# Patient Record
Sex: Female | Born: 1990 | Race: Black or African American | Hispanic: No | Marital: Single | Smoking: Never smoker
Health system: Southern US, Community
[De-identification: ages and names within clinical notes are randomized; demographics above are authoritative.]

## PROBLEM LIST (undated history)

## (undated) DIAGNOSIS — I10 Essential (primary) hypertension: Secondary | ICD-10-CM

## (undated) DIAGNOSIS — N809 Endometriosis, unspecified: Secondary | ICD-10-CM

## (undated) HISTORY — PX: NO PAST SURGERIES: SHX2092

## (undated) HISTORY — DX: Essential (primary) hypertension: I10

---
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
## 2008-06-19 ENCOUNTER — Emergency Department (HOSPITAL_COMMUNITY): Admission: EM | Admit: 2008-06-19 | Discharge: 2008-06-20 | Payer: Self-pay | Admitting: Emergency Medicine

## 2010-01-12 ENCOUNTER — Inpatient Hospital Stay (HOSPITAL_COMMUNITY)
Admission: AD | Admit: 2010-01-12 | Discharge: 2010-01-12 | Payer: Self-pay | Source: Home / Self Care | Admitting: Obstetrics and Gynecology

## 2010-01-12 ENCOUNTER — Ambulatory Visit: Payer: Self-pay | Admitting: Obstetrics and Gynecology

## 2010-03-14 ENCOUNTER — Ambulatory Visit (HOSPITAL_COMMUNITY): Admission: RE | Admit: 2010-03-14 | Discharge: 2010-03-14 | Payer: Self-pay | Admitting: Obstetrics & Gynecology

## 2010-04-25 ENCOUNTER — Inpatient Hospital Stay (HOSPITAL_COMMUNITY): Admission: RE | Admit: 2010-04-25 | Discharge: 2010-04-28 | Payer: Self-pay | Admitting: Family Medicine

## 2010-04-25 ENCOUNTER — Ambulatory Visit: Payer: Self-pay | Admitting: Obstetrics and Gynecology

## 2010-04-26 ENCOUNTER — Encounter: Payer: Self-pay | Admitting: Family Medicine

## 2010-08-16 LAB — CBC
HCT: 29.2 % — ABNORMAL LOW (ref 36.0–46.0)
Hemoglobin: 9.5 g/dL — ABNORMAL LOW (ref 12.0–15.0)
MCH: 23.5 pg — ABNORMAL LOW (ref 26.0–34.0)
MCHC: 32.6 g/dL (ref 30.0–36.0)
MCV: 72.1 fL — ABNORMAL LOW (ref 78.0–100.0)
Platelets: 247 10*3/uL (ref 150–400)
RBC: 4.05 MIL/uL (ref 3.87–5.11)
RDW: 17.5 % — ABNORMAL HIGH (ref 11.5–15.5)
WBC: 9.2 10*3/uL (ref 4.0–10.5)

## 2010-08-16 LAB — RPR: RPR Ser Ql: NONREACTIVE

## 2010-08-16 LAB — GLUCOSE, CAPILLARY: Glucose-Capillary: 93 mg/dL (ref 70–99)

## 2010-08-19 LAB — URINALYSIS, ROUTINE W REFLEX MICROSCOPIC
Bilirubin Urine: NEGATIVE
Glucose, UA: NEGATIVE mg/dL
Hgb urine dipstick: NEGATIVE
Ketones, ur: NEGATIVE mg/dL
Nitrite: NEGATIVE
Protein, ur: NEGATIVE mg/dL
Specific Gravity, Urine: 1.02 (ref 1.005–1.030)
Urobilinogen, UA: 0.2 mg/dL (ref 0.0–1.0)
pH: 7 (ref 5.0–8.0)

## 2010-08-19 LAB — CBC
HCT: 28.6 % — ABNORMAL LOW (ref 36.0–46.0)
Hemoglobin: 9.8 g/dL — ABNORMAL LOW (ref 12.0–15.0)
MCH: 28.4 pg (ref 26.0–34.0)
MCHC: 34.2 g/dL (ref 30.0–36.0)
MCV: 82.8 fL (ref 78.0–100.0)
Platelets: 250 10*3/uL (ref 150–400)
RBC: 3.45 MIL/uL — ABNORMAL LOW (ref 3.87–5.11)
RDW: 12.5 % (ref 11.5–15.5)
WBC: 8.4 10*3/uL (ref 4.0–10.5)

## 2010-08-19 LAB — ABO/RH: ABO/RH(D): A POS

## 2010-08-19 LAB — WET PREP, GENITAL
Trich, Wet Prep: NONE SEEN
Yeast Wet Prep HPF POC: NONE SEEN

## 2010-08-19 LAB — POCT PREGNANCY, URINE: Preg Test, Ur: POSITIVE

## 2010-08-19 LAB — GC/CHLAMYDIA PROBE AMP, GENITAL
Chlamydia, DNA Probe: NEGATIVE
GC Probe Amp, Genital: NEGATIVE

## 2010-08-19 LAB — HCG, QUANTITATIVE, PREGNANCY: hCG, Beta Chain, Quant, S: 16287 m[IU]/mL — ABNORMAL HIGH (ref <5)

## 2010-08-21 ENCOUNTER — Emergency Department (HOSPITAL_COMMUNITY)
Admission: EM | Admit: 2010-08-21 | Discharge: 2010-08-21 | Disposition: A | Discharge status: Home / Self Care | Payer: Medicaid Other | Attending: Emergency Medicine | Admitting: Emergency Medicine

## 2010-08-21 DIAGNOSIS — IMO0002 Reserved for concepts with insufficient information to code with codable children: Secondary | ICD-10-CM | POA: Insufficient documentation

## 2010-08-21 DIAGNOSIS — D573 Sickle-cell trait: Secondary | ICD-10-CM | POA: Insufficient documentation

## 2010-08-24 LAB — CULTURE, ROUTINE-ABSCESS

## 2010-09-19 LAB — URINE MICROSCOPIC-ADD ON

## 2010-09-19 LAB — CBC
HCT: 39.5 % (ref 36.0–46.0)
Hemoglobin: 13.2 g/dL (ref 12.0–15.0)
MCHC: 33.4 g/dL (ref 30.0–36.0)
MCV: 81.2 fL (ref 78.0–100.0)
Platelets: 318 10*3/uL (ref 150–400)
RBC: 4.86 MIL/uL (ref 3.87–5.11)
RDW: 13.1 % (ref 11.5–15.5)
WBC: 14.1 10*3/uL — ABNORMAL HIGH (ref 4.0–10.5)

## 2010-09-19 LAB — BASIC METABOLIC PANEL
BUN: 7 mg/dL (ref 6–23)
CO2: 26 mEq/L (ref 19–32)
Calcium: 8.9 mg/dL (ref 8.4–10.5)
Chloride: 101 mEq/L (ref 96–112)
Creatinine, Ser: 0.91 mg/dL (ref 0.4–1.2)
GFR calc Af Amer: >60 mL/min (ref >60)
GFR calc non Af Amer: >60 mL/min (ref >60)
Glucose, Bld: 154 mg/dL — ABNORMAL HIGH (ref 70–99)
Potassium: 3.7 mEq/L (ref 3.5–5.1)
Sodium: 136 mEq/L (ref 135–145)

## 2010-09-19 LAB — URINALYSIS, ROUTINE W REFLEX MICROSCOPIC
Bilirubin Urine: NEGATIVE
Glucose, UA: NEGATIVE mg/dL
Ketones, ur: NEGATIVE mg/dL
Nitrite: POSITIVE — AB
Protein, ur: 30 mg/dL — AB
Specific Gravity, Urine: 1.012 (ref 1.005–1.030)
Urobilinogen, UA: 0.2 mg/dL (ref 0.0–1.0)
pH: 6 (ref 5.0–8.0)

## 2010-09-19 LAB — DIFFERENTIAL
Basophils Absolute: 0 10*3/uL (ref 0.0–0.1)
Basophils Relative: 0 % (ref 0–1)
Eosinophils Absolute: 0 10*3/uL (ref 0.0–0.7)
Eosinophils Relative: 0 % (ref 0–5)
Lymphocytes Relative: 14 % (ref 12–46)
Lymphs Abs: 1.9 10*3/uL (ref 0.7–4.0)
Monocytes Absolute: 1.1 10*3/uL — ABNORMAL HIGH (ref 0.1–1.0)
Monocytes Relative: 8 % (ref 3–12)
Neutro Abs: 10.9 10*3/uL — ABNORMAL HIGH (ref 1.7–7.7)
Neutrophils Relative %: 78 % — ABNORMAL HIGH (ref 43–77)

## 2010-09-19 LAB — URINE CULTURE: Colony Count: >=100000

## 2014-06-25 LAB — PROCEDURE REPORT - SCANNED: Pap: NEGATIVE

## 2016-02-27 ENCOUNTER — Encounter: Payer: Self-pay | Admitting: *Deleted

## 2017-04-30 ENCOUNTER — Other Ambulatory Visit: Payer: Self-pay | Admitting: Obstetrics and Gynecology

## 2017-04-30 ENCOUNTER — Other Ambulatory Visit (HOSPITAL_COMMUNITY)
Admission: RE | Admit: 2017-04-30 | Discharge: 2017-04-30 | Disposition: A | Discharge status: Home / Self Care | Payer: BC Managed Care – PPO | Source: Ambulatory Visit | Attending: Obstetrics and Gynecology | Admitting: Obstetrics and Gynecology

## 2017-04-30 DIAGNOSIS — Z124 Encounter for screening for malignant neoplasm of cervix: Secondary | ICD-10-CM | POA: Diagnosis present

## 2017-05-02 LAB — CYTOLOGY - PAP
CHLAMYDIA, DNA PROBE: NEGATIVE
DIAGNOSIS: NEGATIVE
Neisseria Gonorrhea: NEGATIVE

## 2017-05-16 ENCOUNTER — Telehealth: Payer: Self-pay

## 2017-05-16 ENCOUNTER — Institutional Professional Consult (permissible substitution): Payer: Self-pay | Admitting: Neurology

## 2017-05-16 NOTE — Telephone Encounter (Signed)
Pt did not show for their appt with Dr. Athar today.  

## 2017-05-18 ENCOUNTER — Encounter: Payer: Self-pay | Admitting: Neurology

## 2017-06-08 ENCOUNTER — Encounter: Payer: Self-pay | Admitting: Neurology

## 2017-06-27 ENCOUNTER — Ambulatory Visit: Payer: BC Managed Care – PPO | Admitting: Neurology

## 2017-06-27 ENCOUNTER — Encounter: Payer: Self-pay | Admitting: Neurology

## 2017-06-27 VITALS — BP 136/80 | HR 84 | Ht 65.0 in | Wt 210.0 lb

## 2017-06-27 DIAGNOSIS — H4710 Unspecified papilledema: Secondary | ICD-10-CM

## 2017-06-27 NOTE — Patient Instructions (Signed)
1.  First we will check MRI of brain with and without contrast and MRV of the head to look for a specific cause of pressure behind the eyes. 2.  The next step would be to order a spinal tap so we can measure the opening pressure of the spinal fluid (to confirm that it is high).  We will test the spinal fluid for cell count, protein, glucose and gram stain/culture. 3.  If testing is consistent with elevated pressure in the head, I will start you on a medication called acetazolamide, which helps reduce pressure in the head.  If we start this medication, I want you to go back to the eye doctor for a recheck in 6 weeks. 4. You will follow up with me in 3 months.

## 2017-06-27 NOTE — Progress Notes (Signed)
Called Ruth Ashley at [facility] to schedule LP. Advised her needs to be after MRI/MRV

## 2017-06-27 NOTE — Progress Notes (Signed)
NEUROLOGY CONSULTATION NOTE  Ruth Ashley MRN: [id number] DOB: May 27, 1991  Referring provider: Dr. Massie Kluver Primary care provider: No PCP  Reason for consult:  Evaluate for pseudotumor cerebri  HISTORY OF PRESENT ILLNESS: Ruth Ashley is a 27 year old female with migraines who presents for evaluation of pseudotumor cerebri.  History and eye exam supplemented by optometrist's note.  She was evaluated by her optometrist, Dr. 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  She reported a recent 40 lbs weight gain.  She has migraines but no new headaches.  She sometimes sees spots but denies visual obscurations.  She denies pulsatile tinnitus.  She has migraines since highschool, described as left frontal, throbbing.  They are associated with photophobia, phonophobia, nausea and vomiting.  They typically last a day and occur once every other week.  Current abortive therapy includes Excedrin.  There are no specific triggers or relieving factors.  PAST MEDICAL HISTORY: History reviewed. No pertinent past medical history.  PAST SURGICAL HISTORY: Past Surgical History:  Procedure Laterality Date  . NO PAST SURGERIES      MEDICATIONS: No current outpatient medications on file prior to visit.   No current facility-administered medications on file prior to visit.     ALLERGIES: No Known Allergies  FAMILY HISTORY: Family History  Problem Relation Age of Onset  . Hypertension Mother   . Kidney disease Mother     SOCIAL HISTORY: Social History   Socioeconomic History  . Marital status: Single    Spouse name: Not on file  . Number of children: Not on file  . Years of education: Not on file  . Highest education level: Not on file  Social Needs  . Financial resource strain: Not on file  . Food insecurity - worry: Not on file  . Food insecurity - inability: Not on file  . Transportation needs - medical: Not on file  .  Transportation needs - non-medical: Not on file  Occupational History  . Not on file  Tobacco Use  . Smoking status: Never Smoker  . Smokeless tobacco: Never Used  Substance and Sexual Activity  . Alcohol use: Yes    Comment: every couple weeks  . Drug use: No  . Sexual activity: Not on file  Other Topics Concern  . Not on file  Social History Narrative  . Not on file    REVIEW OF SYSTEMS: Constitutional: No fevers, chills, or sweats, no generalized fatigue, change in appetite Eyes: No visual changes, double vision, eye pain Ear, nose and throat: No hearing loss, ear pain, nasal congestion, sore throat Cardiovascular: No chest pain, palpitations Respiratory:  No shortness of breath at rest or with exertion, wheezes GastrointestinaI: No nausea, vomiting, diarrhea, abdominal pain, fecal incontinence Genitourinary:  No dysuria, urinary retention or frequency Musculoskeletal:  No neck pain, back pain Integumentary: No rash, pruritus, skin lesions Neurological: as above Psychiatric: No depression, insomnia, anxiety Endocrine: No palpitations, fatigue, diaphoresis, mood swings, change in appetite, change in weight, increased thirst Hematologic/Lymphatic:  No purpura, petechiae. Allergic/Immunologic: no itchy/runny eyes, nasal congestion, recent allergic reactions, rashes  PHYSICAL EXAM: Vitals:   06/27/17 1301  BP: 136/80  Pulse: 84  SpO2: 98%   General: No acute distress.  Patient appears well-groomed.  Head:  Normocephalic/atraumatic Eyes:  fundi examined but not visualized Neck: supple, no paraspinal tenderness, full range of motion Back: No paraspinal tenderness Heart: regular rate and rhythm Lungs: Clear to auscultation bilaterally. Vascular: No carotid  bruits. Neurological Exam: Mental status: alert and oriented to person, place, and time, recent and remote memory intact, fund of knowledge intact, attention and concentration intact, speech fluent and not dysarthric,  language intact. Cranial nerves: CN I: not tested CN II: pupils equal, round and reactive to light, visual fields intact CN III, IV, VI:  full range of motion, no nystagmus, no ptosis CN V: facial sensation intact CN VII: upper and lower face symmetric CN VIII: hearing intact CN IX, X: gag intact, uvula midline CN XI: sternocleidomastoid and trapezius muscles intact CN XII: tongue midline Bulk & Tone: normal, no fasciculations. Motor:  5/5 throughout  Sensation: temperature and vibration sensation intact. Deep Tendon Reflexes:  2+ throughout, toes downgoing.  Finger to nose testing:  Without dysmetria.  Heel to shin:  Without dysmetria.  Gait:  Normal station and stride.  Able to turn and tandem walk. Romberg negative.  IMPRESSION: Bilateral papilledema.  Evaluate for idiopathic intracranial hypertension.  PLAN: 1.  First we will check MRI of brain with and without contrast and MRV of the head to look for secondary etiology such as mass lesion, venous sinus thrombosis. 2.  Afterward, schedule for lumbar puncture to measure the opening pressure of the CSF.  We will analyze CSF for cell count, protein, glucose and gram stain/culture. 3.  If testing is consistent with IIH, I will start acetazolamide 500mg  twice daily.  She should follow up with her optometrist in 6 weeks for re-evaluation and make adjustments accordingly. 4. She will follow up with me in 3 months.    Thank you for allowing me to take part in the care of this patient.  Shon MilletAdam Jaffe, DO  CC: Norva Pavlovitesh Poudyal, OD

## 2017-07-20 ENCOUNTER — Ambulatory Visit
Admission: RE | Admit: 2017-07-20 | Discharge: 2017-07-20 | Disposition: A | Discharge status: Home / Self Care | Payer: BC Managed Care – PPO | Source: Ambulatory Visit | Attending: Neurology | Admitting: Neurology

## 2017-07-20 DIAGNOSIS — H4710 Unspecified papilledema: Secondary | ICD-10-CM

## 2017-07-20 MED ORDER — GADOBENATE DIMEGLUMINE 529 MG/ML IV SOLN
20.0000 mL | Freq: Once | INTRAVENOUS | Status: AC | PRN
  Administered 2017-07-20: 20 mL via INTRAVENOUS

## 2017-07-23 ENCOUNTER — Encounter: Payer: Self-pay | Admitting: Radiology

## 2017-07-23 ENCOUNTER — Ambulatory Visit
Admission: RE | Admit: 2017-07-23 | Discharge: 2017-07-23 | Disposition: A | Discharge status: Home / Self Care | Payer: BC Managed Care – PPO | Source: Ambulatory Visit | Attending: Neurology | Admitting: Neurology

## 2017-07-23 VITALS — BP 120/74 | HR 67

## 2017-07-23 DIAGNOSIS — H4710 Unspecified papilledema: Secondary | ICD-10-CM

## 2017-07-23 LAB — PROTEIN, CSF: TOTAL PROTEIN, CSF: 44 mg/dL (ref 15–45)

## 2017-07-23 LAB — CSF CELL COUNT WITH DIFFERENTIAL
RBC Count, CSF: 0 cells/uL (ref 0–10)
WBC, CSF: 0 cells/uL (ref 0–5)

## 2017-07-23 LAB — GLUCOSE, CSF: GLUCOSE CSF: 68 mg/dL (ref 40–80)

## 2017-07-23 NOTE — Discharge Instructions (Signed)

## 2017-07-24 ENCOUNTER — Telehealth: Payer: Self-pay

## 2017-07-24 MED ORDER — ACETAZOLAMIDE ER 500 MG PO CP12: 500.0000 mg | ORAL_CAPSULE | Freq: Two times a day (BID) | ORAL | 3 refills | Status: DC

## 2017-07-24 NOTE — Telephone Encounter (Signed)
-----   Message from Drema DallasAdam R Jaffe, DO sent at 07/23/2017  3:51 PM EST ----- Spinal tap did show elevated pressure.  I would like to start acetazolamide 500mg  twice daily to help decrease pressure in head.  Side effects may include numbness and tingling, so she should not be worried if she experiences this.  She needs to have her eyes re-examined by the eye doctor in 6 weeks and she should follow up with me afterward.

## 2017-07-24 NOTE — Progress Notes (Signed)
Spoke with patient on the phone, states she is having pain at the site of her LP. Instructed patient to apply ice to the site and call back with any further concerns.

## 2017-07-24 NOTE — Telephone Encounter (Signed)
Called and spoke with Pt, advsd of LP results, Acetazolimide, return to eye dr and f/u with us after.

## 2017-07-27 LAB — CSF CULTURE: RESULT: NO GROWTH

## 2017-07-27 LAB — CSF CULTURE W GRAM STAIN
MICRO NUMBER:: 90212094
SPECIMEN QUALITY:: ADEQUATE

## 2017-09-18 ENCOUNTER — Ambulatory Visit: Payer: BC Managed Care – PPO | Admitting: Neurology

## 2017-10-03 ENCOUNTER — Ambulatory Visit: Payer: BC Managed Care – PPO | Admitting: Neurology

## 2018-08-15 IMAGING — XA DG FLUORO GUIDE SPINAL/SI JT INJ*R*
2 series · 2 of 2 positions shown · non-contrast
Comparison: none

CLINICAL DATA: Bilateral papilledema.  Headaches.

[Series 1: ortho standard · 1 of 1 slices shown (1 of 2)]
[im 1/1]
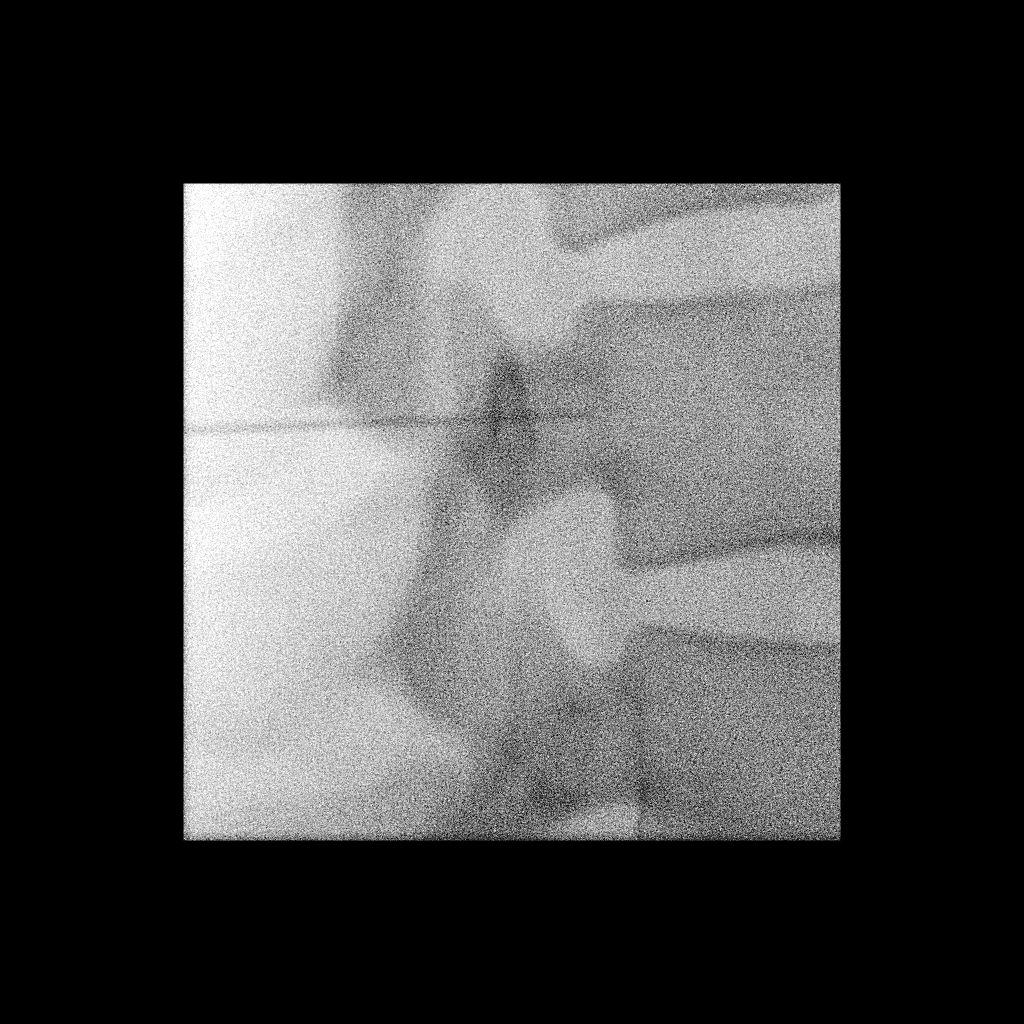

[Series 2: ortho standard · 1 of 1 slices shown (2 of 2)]
[im 1/1]
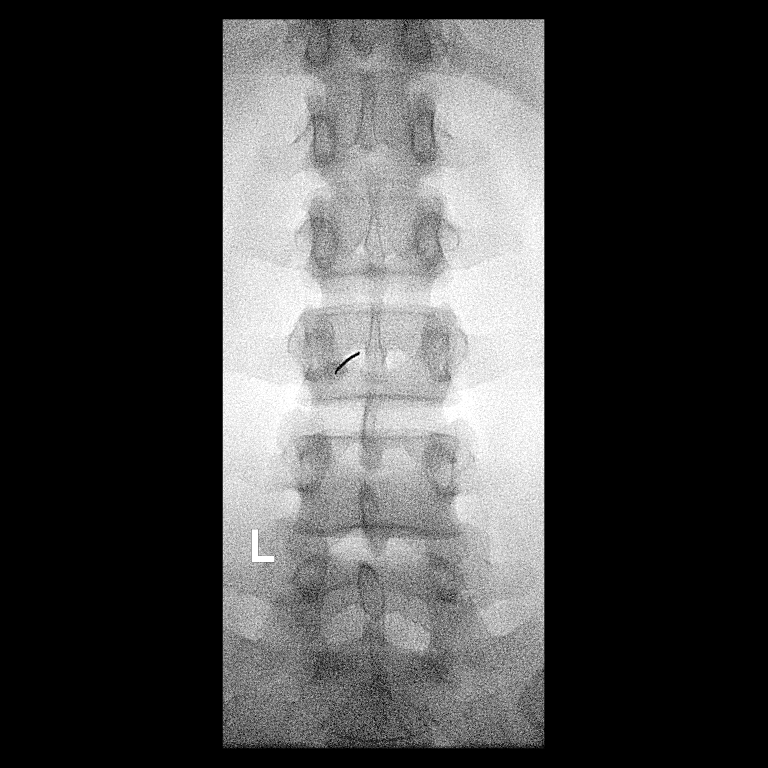

[2 of 2 positions shown; findings below may reference images not displayed]

EXAM:
DIAGNOSTIC LUMBAR PUNCTURE UNDER FLUOROSCOPIC GUIDANCE

FLUOROSCOPY TIME:  Radiation Exposure Index (as provided by the
fluoroscopic device): 16.61 uGy*m2

Fluoroscopy Time:  16 seconds

Number of Acquired Images:  0

PROCEDURE:
Informed consent was obtained from the patient prior to the
procedure, including potential complications of headache, allergy,
and pain. With the patient prone, the lower back was prepped with
Betadine. 1% Lidocaine was used for local anesthesia. Lumbar
puncture was performed at the left paramedian L2-3 level using a 20
gauge needle with return of clear CSF with an opening pressure of
30.5 cm water. 19.0ml of CSF were obtained for laboratory studies.
The closing pressure was normalized at 16.0 cm water. The patient
tolerated the procedure well and there were no apparent
complications.
IMPRESSION: 1. Elevated opening pressure of 30.5 cm water compatible with
idiopathic intracranial hypertension.
2. Following collection of 19.0 mL of clear CSF, the pressure was
normalized at 16.0 cm water.

## 2019-03-13 ENCOUNTER — Ambulatory Visit: Payer: BC Managed Care – PPO | Admitting: Neurology

## 2019-04-11 ENCOUNTER — Other Ambulatory Visit (HOSPITAL_COMMUNITY)
Admission: RE | Admit: 2019-04-11 | Discharge: 2019-04-11 | Disposition: A | Discharge status: Home / Self Care | Payer: BC Managed Care – PPO | Source: Ambulatory Visit | Attending: Obstetrics and Gynecology | Admitting: Obstetrics and Gynecology

## 2019-04-11 ENCOUNTER — Other Ambulatory Visit: Payer: Self-pay | Admitting: Obstetrics and Gynecology

## 2019-04-11 DIAGNOSIS — Z01419 Encounter for gynecological examination (general) (routine) without abnormal findings: Secondary | ICD-10-CM | POA: Insufficient documentation

## 2019-04-17 ENCOUNTER — Other Ambulatory Visit (HOSPITAL_BASED_OUTPATIENT_CLINIC_OR_DEPARTMENT_OTHER): Payer: Self-pay

## 2019-04-17 DIAGNOSIS — R0683 Snoring: Secondary | ICD-10-CM

## 2019-04-17 DIAGNOSIS — R5383 Other fatigue: Secondary | ICD-10-CM

## 2019-04-17 LAB — CYTOLOGY - PAP
Chlamydia: NEGATIVE
Comment: NEGATIVE
Comment: NEGATIVE
Comment: NORMAL
Diagnosis: NEGATIVE
Diagnosis: REACTIVE
High risk HPV: NEGATIVE
Neisseria Gonorrhea: NEGATIVE

## 2019-06-13 ENCOUNTER — Ambulatory Visit (HOSPITAL_BASED_OUTPATIENT_CLINIC_OR_DEPARTMENT_OTHER): Payer: BC Managed Care – PPO | Attending: Internal Medicine | Admitting: Internal Medicine

## 2019-12-09 ENCOUNTER — Other Ambulatory Visit: Payer: Self-pay

## 2019-12-09 ENCOUNTER — Encounter (INDEPENDENT_AMBULATORY_CARE_PROVIDER_SITE_OTHER): Payer: Self-pay | Admitting: Family Medicine

## 2019-12-09 ENCOUNTER — Ambulatory Visit (INDEPENDENT_AMBULATORY_CARE_PROVIDER_SITE_OTHER): Payer: BC Managed Care – PPO | Admitting: Family Medicine

## 2019-12-09 VITALS — BP 139/86 | HR 70 | Temp 98.3°F | Ht 65.0 in | Wt 211.0 lb

## 2019-12-09 DIAGNOSIS — Z6835 Body mass index (BMI) 35.0-35.9, adult: Secondary | ICD-10-CM

## 2019-12-09 DIAGNOSIS — Z9189 Other specified personal risk factors, not elsewhere classified: Secondary | ICD-10-CM | POA: Diagnosis not present

## 2019-12-09 DIAGNOSIS — I10 Essential (primary) hypertension: Secondary | ICD-10-CM

## 2019-12-09 DIAGNOSIS — R5383 Other fatigue: Secondary | ICD-10-CM

## 2019-12-09 DIAGNOSIS — R0602 Shortness of breath: Secondary | ICD-10-CM

## 2019-12-09 DIAGNOSIS — Z0289 Encounter for other administrative examinations: Secondary | ICD-10-CM

## 2019-12-09 DIAGNOSIS — Z1331 Encounter for screening for depression: Secondary | ICD-10-CM | POA: Diagnosis not present

## 2019-12-10 LAB — COMPREHENSIVE METABOLIC PANEL
ALT: 14 IU/L (ref 0–32)
AST: 16 IU/L (ref 0–40)
Albumin/Globulin Ratio: 1.4 (ref 1.2–2.2)
Albumin: 4.5 g/dL (ref 3.8–4.8)
Alkaline Phosphatase: 71 IU/L (ref 48–121)
BUN/Creatinine Ratio: 9 (ref 9–23)
BUN: 7 mg/dL (ref 6–20)
Bilirubin Total: 0.2 mg/dL (ref 0.0–1.2)
CO2: 24 mmol/L (ref 20–29)
Calcium: 9.4 mg/dL (ref 8.7–10.2)
Chloride: 102 mmol/L (ref 96–106)
Creatinine, Ser: 0.8 mg/dL (ref 0.57–1.00)
GFR calc Af Amer: 114 mL/min/{1.73_m2} (ref >59)
GFR calc non Af Amer: 99 mL/min/{1.73_m2} (ref >59)
Globulin, Total: 3.2 g/dL (ref 1.5–4.5)
Glucose: 100 mg/dL — ABNORMAL HIGH (ref 65–99)
Potassium: 4.3 mmol/L (ref 3.5–5.2)
Sodium: 141 mmol/L (ref 134–144)
Total Protein: 7.7 g/dL (ref 6.0–8.5)

## 2019-12-10 LAB — CBC WITH DIFFERENTIAL/PLATELET
Basophils Absolute: 0.1 10*3/uL (ref 0.0–0.2)
Basos: 1 %
EOS (ABSOLUTE): 0.1 10*3/uL (ref 0.0–0.4)
Eos: 1 %
Hematocrit: 41.3 % (ref 34.0–46.6)
Hemoglobin: 13.3 g/dL (ref 11.1–15.9)
Immature Grans (Abs): 0 10*3/uL (ref 0.0–0.1)
Immature Granulocytes: 1 %
Lymphocytes Absolute: 2.9 10*3/uL (ref 0.7–3.1)
Lymphs: 40 %
MCH: 27.5 pg (ref 26.6–33.0)
MCHC: 32.2 g/dL (ref 31.5–35.7)
MCV: 85 fL (ref 79–97)
Monocytes Absolute: 0.6 10*3/uL (ref 0.1–0.9)
Monocytes: 9 %
Neutrophils Absolute: 3.5 10*3/uL (ref 1.4–7.0)
Neutrophils: 48 %
Platelets: 405 10*3/uL (ref 150–450)
RBC: 4.84 x10E6/uL (ref 3.77–5.28)
RDW: 14 % (ref 11.7–15.4)
WBC: 7.2 10*3/uL (ref 3.4–10.8)

## 2019-12-10 LAB — TSH: TSH: 1.39 u[IU]/mL (ref 0.450–4.500)

## 2019-12-10 LAB — LIPID PANEL WITH LDL/HDL RATIO
Cholesterol, Total: 209 mg/dL — ABNORMAL HIGH (ref 100–199)
HDL: 76 mg/dL (ref >39)
LDL Chol Calc (NIH): 100 mg/dL — ABNORMAL HIGH (ref 0–99)
LDL/HDL Ratio: 1.3 ratio (ref 0.0–3.2)
Triglycerides: 197 mg/dL — ABNORMAL HIGH (ref 0–149)
VLDL Cholesterol Cal: 33 mg/dL (ref 5–40)

## 2019-12-10 LAB — FOLATE: Folate: 2.7 ng/mL — ABNORMAL LOW (ref >3.0)

## 2019-12-10 LAB — VITAMIN D 25 HYDROXY (VIT D DEFICIENCY, FRACTURES): Vit D, 25-Hydroxy: 10.5 ng/mL — ABNORMAL LOW (ref 30.0–100.0)

## 2019-12-10 LAB — T3: T3, Total: 137 ng/dL (ref 71–180)

## 2019-12-10 LAB — T4, FREE: Free T4: 1.08 ng/dL (ref 0.82–1.77)

## 2019-12-10 LAB — VITAMIN B12: Vitamin B-12: 408 pg/mL (ref 232–1245)

## 2019-12-10 LAB — INSULIN, RANDOM: INSULIN: 15.5 u[IU]/mL (ref 2.6–24.9)

## 2019-12-10 LAB — HEMOGLOBIN A1C
Est. average glucose Bld gHb Est-mCnc: 105 mg/dL
Hgb A1c MFr Bld: 5.3 % (ref 4.8–5.6)
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
## 2019-12-15 NOTE — Progress Notes (Signed)
Chief Complaint:   OBESITY Ruth Ashley (MR# [id number]) is a 29 y.o. female who presents for evaluation and treatment of obesity and related comorbidities. Current BMI is Body mass index is 35.11 kg/m. Ruth Ashley has been struggling with her weight for many years and has been unsuccessful in either losing weight, maintaining weight loss, or reaching her healthy weight goal.  Ruth Ashley is currently in the action stage of change and ready to dedicate time achieving and maintaining a healthier weight. Ruth Ashley is interested in becoming our patient and working on intensive lifestyle modifications including (but not limited to) diet and exercise for weight loss.  Ruth Ashley habits were reviewed today and are as follows: Her family eats meals together, she thinks her family will eat healthier with her, her desired weight loss is 31 lbs, she started gaining weight in 2016, her heaviest weight ever was 218 pounds, she is a picky eater and doesn't like to eat healthier foods, she skips meals frequently, she is frequently drinking liquids with calories, she frequently makes poor food choices, she has problems with excessive hunger, she has binge eating behaviors and she struggles with emotional eating.  Depression Screen Ruth Ashley Food and Mood (modified PHQ-9) score was 17.  Depression screen PHQ 2/9 12/09/2019  Decreased Interest 1  Down, Depressed, Hopeless 3  PHQ - 2 Score 4  Altered sleeping 3  Tired, decreased energy 3  Change in appetite 3  Feeling bad or failure about yourself  1  Trouble concentrating 3  Moving slowly or fidgety/restless 0  Suicidal thoughts 0  PHQ-9 Score 17  Difficult doing work/chores Somewhat difficult   Subjective:   1. Other fatigue Ruth Ashley admits to daytime somnolence and admits to waking up still tired. Patent has a history of symptoms of daytime fatigue and morning headache. Ruth Ashley generally gets 5 or 6 hours of sleep per night, and states that she  has nightime awakenings. Snoring is present. Apneic episodes are present. Epworth Sleepiness Score is 9.  2. Shortness of breath on exertion Ruth Ashley notes increasing shortness of breath with exercising and seems to be worsening over time with weight gain. She notes getting out of breath sooner with activity than she used to. This has not gotten worse recently. Ruth Ashley denies shortness of breath at rest or orthopnea.  3. Essential hypertension Ruth Ashley blood pressure is borderline elevated today on amlodipine. She would like to improve with diet and exercise.  4. Positive depression screening Ruth Ashley modified PHQ-9 was elevated at 17, and she notes some emotional eating behaviors.  5. At risk for heart disease Ruth Ashley is at a higher than average risk for cardiovascular disease due to obesity.   Assessment/Plan:   1. Other fatigue Ruth Ashley does feel that her weight is causing her energy to be lower than it should be. Fatigue may be related to obesity, depression or many other causes. Labs will be ordered, and in the meanwhile, Cailin will focus on self care including making healthy food choices, increasing physical activity and focusing on stress reduction.  - EKG 12-Lead - CBC with Differential/Platelet - Comprehensive metabolic panel - Hemoglobin A1c - Insulin, random - Lipid Panel With LDL/HDL Ratio - VITAMIN D 25 Hydroxy (Vit-D Deficiency, Fractures) - Vitamin B12 - Folate - T3 - T4, free - TSH  2. Shortness of breath on exertion Ruth Ashley does feel that she gets out of breath more easily that she used to when she exercises. Ruth Ashley shortness of breath appears to be obesity related and  exercise induced. She has agreed to work on weight loss and gradually increase exercise to treat her exercise induced shortness of breath. Will continue to monitor closely.  3. Essential hypertension Ruth Ashley is working on healthy weight loss and exercise to improve blood pressure  control. We will watch for signs of hypotension as she continues her lifestyle modifications. We will check labs today and will follow up.  - CBC with Differential/Platelet - Comprehensive metabolic panel - Hemoglobin A1c - Insulin, random - Lipid Panel With LDL/HDL Ratio - VITAMIN D 25 Hydroxy (Vit-D Deficiency, Fractures) - Vitamin B12 - Folate - T3 - T4, free - TSH  4. Positive depression screening Ruth Ashley had a positive depression screening. Depression is commonly associated with obesity and often results in emotional eating behaviors. We will monitor this closely and work on CBT to help improve the non-hunger eating patterns. We will refer to Dr. Dewaine Conger, our Bariatric Psychologist for evaluation.  5. At risk for heart disease Ruth Ashley was given approximately 30 minutes of coronary artery disease prevention counseling today. She is 29 y.o. female and has risk factors for heart disease including obesity. We discussed intensive lifestyle modifications today with an emphasis on specific weight loss instructions and strategies.   Repetitive spaced learning was employed today to elicit superior memory formation and behavioral change.  6. Class 2 severe obesity with serious comorbidity and body mass index (BMI) of 35.0 to 35.9 in adult, unspecified obesity type (HCC) Ruth Ashley is currently in the action stage of change and her goal is to continue with weight loss efforts. I recommend Ruth Ashley begin the structured treatment plan as follows:  She has agreed to the Category 3 Plan.  Exercise goals: No exercise has been prescribed for now, while we concentrate on nutritional changes.   Behavioral modification strategies: meal planning and cooking strategies and dealing with family or coworker sabotage.  She was informed of the importance of frequent follow-up visits to maximize her success with intensive lifestyle modifications for her multiple health conditions. She was informed we would  discuss her lab results at her next visit unless there is a critical issue that needs to be addressed sooner. Ruth Ashley agreed to keep her next visit at the agreed upon time to discuss these results.  Objective:   Blood pressure 139/86, pulse 70, temperature 98.3 F (36.8 C), temperature source Oral, height 5\' 5"  (1.651 m), weight 211 lb (95.7 kg), last menstrual period 11/30/2019, SpO2 99 %. Body mass index is 35.11 kg/m.  EKG: Normal sinus rhythm, rate 80 BPM.  Indirect Calorimeter completed today shows a VO2 of 265 and a REE of 1847.  Her calculated basal metabolic rate is 12/02/2019 thus her basal metabolic rate is better than expected.  General: Cooperative, alert, well developed, in no acute distress. HEENT: Conjunctivae and lids unremarkable. Cardiovascular: Regular rhythm.  Lungs: Normal work of breathing. Neurologic: No focal deficits.   Lab Results  Component Value Date   CREATININE 0.80 12/09/2019   BUN 7 12/09/2019   NA 141 12/09/2019   K 4.3 12/09/2019   CL 102 12/09/2019   CO2 24 12/09/2019   Lab Results  Component Value Date   ALT 14 12/09/2019   AST 16 12/09/2019   ALKPHOS 71 12/09/2019   BILITOT 0.2 12/09/2019   Lab Results  Component Value Date   HGBA1C 5.3 12/09/2019   Lab Results  Component Value Date   INSULIN 15.5 12/09/2019   Lab Results  Component Value Date   TSH 1.390 12/09/2019  Lab Results  Component Value Date   CHOL 209 (H) 12/09/2019   HDL 76 12/09/2019   LDLCALC 100 (H) 12/09/2019   TRIG 197 (H) 12/09/2019   Lab Results  Component Value Date   WBC 7.2 12/09/2019   HGB 13.3 12/09/2019   HCT 41.3 12/09/2019   MCV 85 12/09/2019   PLT 405 12/09/2019   No results found for: IRON, TIBC, FERRITIN  Attestation Statements:   Reviewed by clinician on day of visit: allergies, medications, problem list, medical history, surgical history, family history, social history, and previous encounter notes.   I, Burt Knack, am acting as  transcriptionist for Quillian Quince, MD.  I have reviewed the above documentation for accuracy and completeness, and I agree with the above. - Quillian Quince, MD

## 2019-12-23 ENCOUNTER — Ambulatory Visit (INDEPENDENT_AMBULATORY_CARE_PROVIDER_SITE_OTHER): Payer: BC Managed Care – PPO | Admitting: Family Medicine

## 2019-12-24 ENCOUNTER — Other Ambulatory Visit: Payer: Self-pay

## 2019-12-24 ENCOUNTER — Telehealth (INDEPENDENT_AMBULATORY_CARE_PROVIDER_SITE_OTHER): Payer: BC Managed Care – PPO | Admitting: Psychology

## 2019-12-24 ENCOUNTER — Encounter (INDEPENDENT_AMBULATORY_CARE_PROVIDER_SITE_OTHER): Payer: Self-pay

## 2019-12-24 ENCOUNTER — Telehealth (INDEPENDENT_AMBULATORY_CARE_PROVIDER_SITE_OTHER): Payer: Self-pay | Admitting: Psychology

## 2019-12-24 NOTE — Telephone Encounter (Signed)
  Office: [phone]  /  Fax: [phone]  Date of Call: December 24, 2019  Time of Call: 12:01pm Provider: Lawerance Cruel, PsyD  CONTENT: This provider called Perlie Gold at 12:01pm as she was scheduled for a new patient MyChart Video Visit at 12pm, but her MyChart status still indicated pending. A HIPAA compliant voicemail was left requesting a call back. Of note, this provider stayed on the MyChart Video Visit appointment for 5 minutes prior to signing off per the clinic's grace period policy.    PLAN: This provider will wait for Tyisha to call back. No further follow-up planned by this provider.

## 2020-07-22 HISTORY — PX: OTHER SURGICAL HISTORY: SHX169

## 2021-03-10 ENCOUNTER — Inpatient Hospital Stay (HOSPITAL_COMMUNITY)
Admission: AD | Admit: 2021-03-10 | Discharge: 2021-03-10 | Disposition: A | Discharge status: Home / Self Care | Payer: BC Managed Care – PPO | Attending: Family Medicine | Admitting: Family Medicine

## 2021-03-10 ENCOUNTER — Inpatient Hospital Stay (HOSPITAL_COMMUNITY): Payer: BC Managed Care – PPO

## 2021-03-10 ENCOUNTER — Encounter (HOSPITAL_COMMUNITY): Payer: Self-pay

## 2021-03-10 DIAGNOSIS — O10911 Unspecified pre-existing hypertension complicating pregnancy, first trimester: Secondary | ICD-10-CM

## 2021-03-10 DIAGNOSIS — R109 Unspecified abdominal pain: Secondary | ICD-10-CM | POA: Diagnosis not present

## 2021-03-10 DIAGNOSIS — Z3A01 Less than 8 weeks gestation of pregnancy: Secondary | ICD-10-CM | POA: Diagnosis not present

## 2021-03-10 DIAGNOSIS — O3680X0 Pregnancy with inconclusive fetal viability, not applicable or unspecified: Secondary | ICD-10-CM

## 2021-03-10 DIAGNOSIS — O26891 Other specified pregnancy related conditions, first trimester: Secondary | ICD-10-CM | POA: Diagnosis not present

## 2021-03-10 DIAGNOSIS — O209 Hemorrhage in early pregnancy, unspecified: Secondary | ICD-10-CM | POA: Diagnosis not present

## 2021-03-10 DIAGNOSIS — O10011 Pre-existing essential hypertension complicating pregnancy, first trimester: Secondary | ICD-10-CM | POA: Insufficient documentation

## 2021-03-10 DIAGNOSIS — R1031 Right lower quadrant pain: Secondary | ICD-10-CM | POA: Diagnosis present

## 2021-03-10 DIAGNOSIS — O10919 Unspecified pre-existing hypertension complicating pregnancy, unspecified trimester: Secondary | ICD-10-CM

## 2021-03-10 HISTORY — DX: Endometriosis, unspecified: N80.9

## 2021-03-10 LAB — COMPREHENSIVE METABOLIC PANEL
ALT: 15 U/L (ref 0–44)
AST: 17 U/L (ref 15–41)
Albumin: 3.5 g/dL (ref 3.5–5.0)
Alkaline Phosphatase: 47 U/L (ref 38–126)
Anion gap: 9 (ref 5–15)
BUN: <5 mg/dL — ABNORMAL LOW (ref 6–20)
CO2: 23 mmol/L (ref 22–32)
Calcium: 9.1 mg/dL (ref 8.9–10.3)
Chloride: 104 mmol/L (ref 98–111)
Creatinine, Ser: 0.79 mg/dL (ref 0.44–1.00)
GFR, Estimated: >60 mL/min (ref >60)
Glucose, Bld: 96 mg/dL (ref 70–99)
Potassium: 3.9 mmol/L (ref 3.5–5.1)
Sodium: 136 mmol/L (ref 135–145)
Total Bilirubin: 0.4 mg/dL (ref 0.3–1.2)
Total Protein: 7.5 g/dL (ref 6.5–8.1)

## 2021-03-10 LAB — WET PREP, GENITAL
Clue Cells Wet Prep HPF POC: NONE SEEN
Sperm: NONE SEEN
Trich, Wet Prep: NONE SEEN
Yeast Wet Prep HPF POC: NONE SEEN

## 2021-03-10 LAB — CBC
HCT: 35.7 % — ABNORMAL LOW (ref 36.0–46.0)
Hemoglobin: 12.1 g/dL (ref 12.0–15.0)
MCH: 27.4 pg (ref 26.0–34.0)
MCHC: 33.9 g/dL (ref 30.0–36.0)
MCV: 80.8 fL (ref 80.0–100.0)
Platelets: 354 10*3/uL (ref 150–400)
RBC: 4.42 MIL/uL (ref 3.87–5.11)
RDW: 13.4 % (ref 11.5–15.5)
WBC: 7.4 10*3/uL (ref 4.0–10.5)
nRBC: 0 % (ref 0.0–0.2)

## 2021-03-10 LAB — URINALYSIS, ROUTINE W REFLEX MICROSCOPIC
Bilirubin Urine: NEGATIVE
Glucose, UA: NEGATIVE mg/dL
Hgb urine dipstick: NEGATIVE
Ketones, ur: NEGATIVE mg/dL
Leukocytes,Ua: NEGATIVE
Nitrite: NEGATIVE
Protein, ur: NEGATIVE mg/dL
Specific Gravity, Urine: 1.005 (ref 1.005–1.030)
pH: 7 (ref 5.0–8.0)

## 2021-03-10 LAB — PROTEIN / CREATININE RATIO, URINE
Creatinine, Urine: 55.11 mg/dL
Total Protein, Urine: <6 mg/dL

## 2021-03-10 LAB — POCT PREGNANCY, URINE: Preg Test, Ur: POSITIVE — AB

## 2021-03-10 LAB — HCG, QUANTITATIVE, PREGNANCY: hCG, Beta Chain, Quant, S: 21507 m[IU]/mL — ABNORMAL HIGH (ref <5)

## 2021-03-10 LAB — TYPE AND SCREEN
ABO/RH(D): A POS
Antibody Screen: NEGATIVE

## 2021-03-10 MED ORDER — PRENATAL VITAMIN AND MINERAL 28-0.8 MG PO TABS: 1.0000 | ORAL_TABLET | Freq: Every day | ORAL | 11 refills | Status: DC

## 2021-03-10 MED ORDER — ACETAMINOPHEN 500 MG PO TABS
1000.0000 mg | ORAL_TABLET | Freq: Once | ORAL | Status: AC
  Administered 2021-03-10: 1000 mg via ORAL
  Filled 2021-03-10: qty 2

## 2021-03-10 MED ORDER — CYCLOBENZAPRINE HCL 5 MG PO TABS
5.0000 mg | ORAL_TABLET | Freq: Once | ORAL | Status: AC
  Administered 2021-03-10: 5 mg via ORAL
  Filled 2021-03-10: qty 1

## 2021-03-10 NOTE — MAU Note (Signed)
.  Ruth Ashley is a 30 y.o. at 108w3d here in MAU reporting: lower abdominal cramping and vaginal bleeding that has been going on all month. She states she just found out she was pregnant. Has been taking tylenol for the pain.  LMP: 01/24/21  Pain score: 8 Vitals:   03/10/21 0901  BP: (!) 156/96  Pulse: 94  Resp: 17  Temp: 98.4 F (36.9 C)  SpO2: 98%   Lab orders placed from triage:  UPT, UA

## 2021-03-10 NOTE — MAU Provider Note (Signed)
History     [id number]  Arrival date and time: 03/10/21 0846    Chief Complaint  Patient presents with   Abdominal Pain   Vaginal Bleeding     HPI Ruth Ashley is a 30 y.o. at [redacted]w[redacted]d by sure LMP with PMHx notable for abdominoplasty in the past year, who presents for vaginal bleeding and RLQ cramping.    Patient reports she took a pregnancy test five days ago that was positive She did so because she'd had a period all throughout September which is unusual for her Prior to getting pregnant her period was regular Bleeding has been light but consistent Also been having cramping in LLQ worse over the past week Denies burning or pain with urination, vaginal discharge Had an abdominoplasty in the past year, otherwise no surgeries Hx of one prior TAB Unplanned pregnancy, would be desired if viable Has not taken anything for her pain in RLQ Worse after abdominal exam  --/--/A POS (10/06 1024)  OB History     Gravida  4   Para  2   Term  2   Preterm  0   AB  0   Living  2      SAB  0   IAB  0   Ectopic  0   Multiple  0   Live Births  2           Past Medical History:  Diagnosis Date   Endometriosis    Hypertension     Past Surgical History:  Procedure Laterality Date   NO PAST SURGERIES     tummy tuck  07/22/2020    Family History  Problem Relation Age of Onset   Hypertension Mother    Kidney disease Mother    Cancer Father    Sudden death Father     Social History   Socioeconomic History   Marital status: Single    Spouse name: Not on file   Number of children: Not on file   Years of education: Not on file   Highest education level: Not on file  Occupational History   Occupation: Dispensing optician  Tobacco Use   Smoking status: Never   Smokeless tobacco: Never  Substance and Sexual Activity   Alcohol use: Yes    Comment: every couple weeks   Drug use: No   Sexual activity: Yes    Birth control/protection: None  Other  Topics Concern   Not on file  Social History Narrative   Not on file   Social Determinants of Health   Financial Resource Strain: Not on file  Food Insecurity: Not on file  Transportation Needs: Not on file  Physical Activity: Not on file  Stress: Not on file  Social Connections: Not on file  Intimate Partner Violence: Not on file    No Known Allergies  No current facility-administered medications on file prior to encounter.   Current Outpatient Medications on File Prior to Encounter  Medication Sig Dispense Refill   acetaminophen (TYLENOL) 500 MG tablet Take 1,000 mg by mouth every 6 (six) hours as needed.     amLODipine (NORVASC) 10 MG tablet Take 10 mg by mouth daily.       ROS Pertinent positives and negative per HPI, all others reviewed and negative  Physical Exam   BP (!) 148/92 (BP Location: Right Arm)   Pulse 88   Temp 98.4 F (36.9 C) (Oral)   Resp 18   Ht 5\' 5"  (1.651 m)  Wt 99.2 kg   LMP 01/24/2021   SpO2 100%   BMI 36.38 kg/m   Patient Vitals for the past 24 hrs:  BP Temp Temp src Pulse Resp SpO2 Height Weight  03/10/21 0922 (!) 148/92 -- -- 88 18 100 % -- --  03/10/21 0901 (!) 156/96 98.4 F (36.9 C) Oral 94 17 98 % 5\' 5"  (1.651 m) 99.2 kg    Physical Exam Vitals reviewed.  Constitutional:      General: She is not in acute distress.    Appearance: She is well-developed. She is not diaphoretic.  Eyes:     General: No scleral icterus. Pulmonary:     Effort: Pulmonary effort is normal. No respiratory distress.  Abdominal:     General: There is no distension.     Palpations: Abdomen is soft.     Tenderness: There is abdominal tenderness in the right lower quadrant. There is no guarding or rebound.  Skin:    General: Skin is warm and dry.  Neurological:     Mental Status: She is alert.     Coordination: Coordination normal.     Cervical Exam    Bedside Ultrasound Pt informed that the ultrasound is considered a limited OB ultrasound  and is not intended to be a complete ultrasound exam.  Patient also informed that the ultrasound is not being completed with the intent of assessing for fetal or placental anomalies or any pelvic abnormalities.  Explained that the purpose of today's ultrasound is to assess for  viability.  Patient acknowledges the purpose of the exam and the limitations of the study.    My interpretation: IUGS seen, unable to visualize other structures transabdominally  FHT N/a  Labs Results for orders placed or performed during the hospital encounter of 03/10/21 (from the past 24 hour(s))  Urinalysis, Routine w reflex microscopic Urine, Clean Catch     Status: Abnormal   Collection Time: 03/10/21  9:05 AM  Result Value Ref Range   Color, Urine STRAW (A) YELLOW   APPearance CLEAR CLEAR   Specific Gravity, Urine 1.005 1.005 - 1.030   pH 7.0 5.0 - 8.0   Glucose, UA NEGATIVE NEGATIVE mg/dL   Hgb urine dipstick NEGATIVE NEGATIVE   Bilirubin Urine NEGATIVE NEGATIVE   Ketones, ur NEGATIVE NEGATIVE mg/dL   Protein, ur NEGATIVE NEGATIVE mg/dL   Nitrite NEGATIVE NEGATIVE   Leukocytes,Ua NEGATIVE NEGATIVE  Pregnancy, urine POC     Status: Abnormal   Collection Time: 03/10/21  9:36 AM  Result Value Ref Range   Preg Test, Ur POSITIVE (A) NEGATIVE  Wet prep, genital     Status: Abnormal   Collection Time: 03/10/21 10:21 AM   Specimen: Vaginal  Result Value Ref Range   Yeast Wet Prep HPF POC NONE SEEN NONE SEEN   Trich, Wet Prep NONE SEEN NONE SEEN   Clue Cells Wet Prep HPF POC NONE SEEN NONE SEEN   WBC, Wet Prep HPF POC MODERATE (A) NONE SEEN   Sperm NONE SEEN   CBC     Status: Abnormal   Collection Time: 03/10/21 10:24 AM  Result Value Ref Range   WBC 7.4 4.0 - 10.5 K/uL   RBC 4.42 3.87 - 5.11 MIL/uL   Hemoglobin 12.1 12.0 - 15.0 g/dL   HCT 05/10/21 (L) 82.9 - 56.2 %   MCV 80.8 80.0 - 100.0 fL   MCH 27.4 26.0 - 34.0 pg   MCHC 33.9 30.0 - 36.0 g/dL   RDW 13.0 86.5 -  15.5 %   Platelets 354 150 - 400 K/uL    nRBC 0.0 0.0 - 0.2 %  Comprehensive metabolic panel     Status: Abnormal   Collection Time: 03/10/21 10:24 AM  Result Value Ref Range   Sodium 136 135 - 145 mmol/L   Potassium 3.9 3.5 - 5.1 mmol/L   Chloride 104 98 - 111 mmol/L   CO2 23 22 - 32 mmol/L   Glucose, Bld 96 70 - 99 mg/dL   BUN <5 (L) 6 - 20 mg/dL   Creatinine, Ser 4.01 0.44 - 1.00 mg/dL   Calcium 9.1 8.9 - 02.7 mg/dL   Total Protein 7.5 6.5 - 8.1 g/dL   Albumin 3.5 3.5 - 5.0 g/dL   AST 17 15 - 41 U/L   ALT 15 0 - 44 U/L   Alkaline Phosphatase 47 38 - 126 U/L   Total Bilirubin 0.4 0.3 - 1.2 mg/dL   GFR, Estimated >25 >36 mL/min   Anion gap 9 5 - 15  Type and screen [facility]     Status: None   Collection Time: 03/10/21 10:24 AM  Result Value Ref Range   ABO/RH(D) A POS    Antibody Screen NEG    Sample Expiration      03/13/2021,2359 Performed at [facility], [address], [place], [place] [address]     Imaging No results found.  MAU Course  Procedures Lab Orders         Wet prep, genital         Urinalysis, Routine w reflex microscopic Urine, Clean Catch         CBC         Comprehensive metabolic panel         hCG, quantitative, pregnancy         Protein / creatinine ratio, urine         Pregnancy, urine POC    Meds ordered this encounter  Medications   cyclobenzaprine (FLEXERIL) tablet 5 mg   acetaminophen (TYLENOL) tablet 1,000 mg   Prenatal Vit-Fe Fumarate-FA (PRENATAL VITAMIN AND MINERAL) 28-0.8 MG TABS    Sig: Take 1 tablet by mouth daily.    Dispense:  30 tablet    Refill:  11   Imaging Orders         US OB LESS THAN 14 WEEKS WITH OB TRANSVAGINAL     MDM moderate  Assessment and Plan  #Vaginal bleeding in pregnancy, first trimester #Abdominal pain in pregnancy, first trimester Ectopic workup completed, patient with normal IUP with normal heart rate. Discussed that studies show 80-90% of pregnancies with vaginal bleeding go on to have normal live birth but  there is increased risk of miscarriage. Blood type A+, rhogam not indicated. Discussed return precautions including severe abdominal pain, heavy vaginal bleeding, and fever. Will send rx for prenatals, already has OB intake scheduled with [facility].  #Chronic hypertension Poorly controlled, not taking her amlodipine. Advised to resume taking, start prenatal ASA in second trimester.   #FWB Normal IUP with normal FHR on [place]  Dispo: Discharged to home in stable condition with return precautions.   Venora Maples, MD/MPH 03/10/21 11:41 AM  Allergies as of 03/10/2021   No Known Allergies      Medication List     TAKE these medications    acetaminophen 500 MG tablet Commonly known as: TYLENOL Take 1,000 mg by mouth every 6 (six) hours as needed.   amLODipine 10 MG  tablet Commonly known as: NORVASC Take 10 mg by mouth daily.   Prenatal Vitamin and Mineral 28-0.8 MG Tabs Take 1 tablet by mouth daily.

## 2021-03-11 LAB — GC/CHLAMYDIA PROBE AMP (~~LOC~~) NOT AT ARMC
Chlamydia: NEGATIVE
Comment: NEGATIVE
Comment: NORMAL
Neisseria Gonorrhea: NEGATIVE

## 2021-10-03 ENCOUNTER — Emergency Department (HOSPITAL_COMMUNITY)
Admission: EM | Admit: 2021-10-03 | Discharge: 2021-10-03 | Disposition: A | Discharge status: Home / Self Care | Payer: BC Managed Care – PPO | Attending: Emergency Medicine | Admitting: Emergency Medicine

## 2021-10-03 ENCOUNTER — Other Ambulatory Visit: Payer: Self-pay

## 2021-10-03 ENCOUNTER — Emergency Department (HOSPITAL_COMMUNITY): Payer: BC Managed Care – PPO

## 2021-10-03 ENCOUNTER — Encounter (HOSPITAL_COMMUNITY): Payer: Self-pay

## 2021-10-03 DIAGNOSIS — M25561 Pain in right knee: Secondary | ICD-10-CM | POA: Diagnosis not present

## 2021-10-03 DIAGNOSIS — R0781 Pleurodynia: Secondary | ICD-10-CM

## 2021-10-03 DIAGNOSIS — R0602 Shortness of breath: Secondary | ICD-10-CM | POA: Insufficient documentation

## 2021-10-03 DIAGNOSIS — R071 Chest pain on breathing: Secondary | ICD-10-CM | POA: Insufficient documentation

## 2021-10-03 DIAGNOSIS — R079 Chest pain, unspecified: Secondary | ICD-10-CM | POA: Diagnosis present

## 2021-10-03 LAB — TROPONIN I (HIGH SENSITIVITY)
Troponin I (High Sensitivity): 2 ng/L (ref <18)
Troponin I (High Sensitivity): 5 ng/L (ref <18)

## 2021-10-03 LAB — CBC
HCT: 40.5 % (ref 36.0–46.0)
Hemoglobin: 13.3 g/dL (ref 12.0–15.0)
MCH: 27.6 pg (ref 26.0–34.0)
MCHC: 32.8 g/dL (ref 30.0–36.0)
MCV: 84 fL (ref 80.0–100.0)
Platelets: 449 10*3/uL — ABNORMAL HIGH (ref 150–400)
RBC: 4.82 MIL/uL (ref 3.87–5.11)
RDW: 14.4 % (ref 11.5–15.5)
WBC: 7.9 10*3/uL (ref 4.0–10.5)
nRBC: 0 % (ref 0.0–0.2)

## 2021-10-03 LAB — BASIC METABOLIC PANEL
Anion gap: 9 (ref 5–15)
BUN: <5 mg/dL — ABNORMAL LOW (ref 6–20)
CO2: 25 mmol/L (ref 22–32)
Calcium: 8.8 mg/dL — ABNORMAL LOW (ref 8.9–10.3)
Chloride: 104 mmol/L (ref 98–111)
Creatinine, Ser: 0.81 mg/dL (ref 0.44–1.00)
GFR, Estimated: >60 mL/min (ref >60)
Glucose, Bld: 96 mg/dL (ref 70–99)
Potassium: 4.1 mmol/L (ref 3.5–5.1)
Sodium: 138 mmol/L (ref 135–145)

## 2021-10-03 LAB — I-STAT BETA HCG BLOOD, ED (MC, WL, AP ONLY): I-stat hCG, quantitative: <5 m[IU]/mL (ref <5)

## 2021-10-03 LAB — D-DIMER, QUANTITATIVE: D-Dimer, Quant: <0.27 ug/mL-FEU (ref 0.00–0.50)

## 2021-10-03 NOTE — Discharge Instructions (Signed)
Please read and follow all provided instructions. ? ?Your diagnoses today include:  ?1. Pleuritic chest pain   ? ? ?Tests performed today include: ?An EKG of your heart ?A chest x-ray ?Cardiac enzymes - a blood test for heart muscle damage ?Blood counts and electrolytes ?Screening test for blood clot: Was negative ?Vital signs. See below for your results today.  ? ?Medications prescribed:  ?None ? ?Take any prescribed medications only as directed. ? ?Follow-up instructions: ?Please follow-up with your primary care provider as soon as you can for further evaluation of your symptoms.  ? ?Return instructions:  ?SEEK IMMEDIATE MEDICAL ATTENTION IF: ?You have severe chest pain, especially if the pain is crushing or pressure-like and spreads to the arms, back, neck, or jaw, or if you have sweating, nausea or vomiting, or trouble with breathing. THIS IS AN EMERGENCY. Do not wait to see if the pain will go away. Get medical help at once. Call 911. DO NOT drive yourself to the hospital.  ?Your chest pain gets worse and does not go away after a few minutes of rest.  ?You have an attack of chest pain lasting longer than what you usually experience.  ?You have significant dizziness, if you pass out, or have trouble walking.  ?You have chest pain not typical of your usual pain for which you originally saw your caregiver.  ?You have any other emergent concerns regarding your health. ? ?Additional Information: ?Chest pain comes from many different causes. Your caregiver has diagnosed you as having chest pain that is not specific for one problem, but does not require admission.  You are at low risk for an acute heart condition or other serious illness.  ? ?Your vital signs today were: ?BP 128/81   Pulse 81   Temp 98.4 ?F (36.9 ?C) (Oral)   Resp 13   Ht 5\' 5"  (1.651 m)   Wt 95.3 kg   LMP 01/18/2021 (Approximate)   SpO2 100%   Breastfeeding Unknown   BMI 34.95 kg/m?  ?If your blood pressure (BP) was elevated above 135/85  this visit, please have this repeated by your doctor within one month. ?-------------- ? ? ?

## 2021-10-03 NOTE — ED Provider Triage Note (Signed)
Emergency Medicine Provider Triage Evaluation Note ? ?Ruth Ashley , a 31 y.o. female  was evaluated in triage.  Pt complains of CP, onset 1AM, woke from sleep, unable to go back to sleep, pain is intermittent, located midsternum and under left breast, does not radiate, with SHOB, dizziness. Denies n/v. ?Similar to prior episodes, never seen for prior symptoms, SHOB prompted work up today.  ?Hx HTN, on medication. NO hx to DM or high cholesterol. ?Family hx- mom had a monitor at one point unknown why ?Review of Systems  ?Positive: CP, SHOB, dizziness ?Negative: Nausea, vomiitng.  ? ?Physical Exam  ?BP (!) 162/105 (BP Location: Right Arm)   Pulse 96   Temp 98.4 ?F (36.9 ?C) (Oral)   Resp 18   Ht 5\' 5"  (1.651 m)   Wt 95.3 kg   LMP 01/24/2021   SpO2 100%   BMI 34.95 kg/m?  ?Gen:   Awake, no distress   ?Resp:  Normal effort  ?MSK:   Moves extremities without difficulty  ?Other:  HR RRR ? ?Medical Decision Making  ?Medically screening exam initiated at 10:50 AM.  Appropriate orders placed.  Ruth Ashley was informed that the remainder of the evaluation will be completed by another provider, this initial triage assessment does not replace that evaluation, and the importance of remaining in the ED until their evaluation is complete. ? ? ?  ?Tacy Learn, PA-C ?10/03/21 1052 ? ?

## 2021-10-03 NOTE — ED Provider Notes (Signed)
?[facility] ?Provider Note ? ? ?CSN: [id number] ?Arrival date & time: 10/03/21  1024 ? ?  ? ?History ? ?Chief Complaint  ?Patient presents with  ? Chest Pain  ? ? ?Ruth EgeQuenisha Ashley is a 31 y.o. female. ? ?Patient with history of hypertension presents to the emergency department for evaluation of chest pain.  Pain occurs in the left chest.  It woke her from sleep at around 1 AM today.  Pain was worse with deep breathing.  She had some associated shortness of breath.  It lasted till about 3 or 4 AM and then went away.  She went to work this morning and the symptoms recurred, prompting emergency department visit.  No fevers or cough.  She has some pain around her right knee that is intermittent.  She does not get this pain on the left.  As far as her chest pain, she has had this pain before.  It occurs approximately once every 2 weeks.  It can occur at anytime of day.  It does not exertional in nature and is not associated with diaphoresis or vomiting.  Patient denies risk factors for pulmonary embolism including: unilateral leg swelling, history of DVT/PE/other blood clots, use of exogenous hormones, recent immobilizations, recent surgery, recent travel (>4hr segment), malignancy, hemoptysis.  ? ? ? ? ?  ? ?Home Medications ?Prior to Admission medications   ?Medication Sig Start Date End Date Taking? Authorizing Provider  ?acetaminophen (TYLENOL) 500 MG tablet Take 1,000 mg by mouth every 6 (six) hours as needed.    [provider]  ?amLODipine (NORVASC) 10 MG tablet Take 10 mg by mouth daily.    [provider]  ?Prenatal Vit-Fe Fumarate-FA (PRENATAL VITAMIN AND MINERAL) 28-0.8 MG TABS Take 1 tablet by mouth daily. 03/10/21   Venora MaplesEckstat, Matthew M, MD  ?   ? ?Allergies    ?Patient has no known allergies.   ? ?Review of Systems   ?Review of Systems ? ?Physical Exam ?Updated Vital Signs ?BP (!) 148/101   Pulse 74   Temp 98.4 ?F (36.9 ?C) (Oral)   Resp 16   Ht 5\' 5"   (1.651 m)   Wt 95.3 kg   LMP 01/18/2021 (Approximate)   SpO2 99%   Breastfeeding Unknown   BMI 34.95 kg/m?  ? ?Physical Exam ?Vitals and nursing note reviewed.  ?Constitutional:   ?   Appearance: She is well-developed. She is not diaphoretic.  ?HENT:  ?   Head: Normocephalic and atraumatic.  ?   Mouth/Throat:  ?   Mouth: Mucous membranes are not dry.  ?Eyes:  ?   Conjunctiva/sclera: Conjunctivae normal.  ?Neck:  ?   Vascular: Normal carotid pulses. No JVD.  ?   Trachea: Trachea normal. No tracheal deviation.  ?Cardiovascular:  ?   Rate and Rhythm: Normal rate and regular rhythm.  ?   Pulses: No decreased pulses.     ?     Radial pulses are 2+ on the right side and 2+ on the left side.  ?   Heart sounds: Normal heart sounds, S1 normal and S2 normal. No murmur heard. ?Pulmonary:  ?   Effort: Pulmonary effort is normal. No respiratory distress.  ?   Breath sounds: No wheezing.  ?Chest:  ?   Chest wall: No tenderness.  ?Abdominal:  ?   General: Bowel sounds are normal.  ?   Palpations: Abdomen is soft.  ?   Tenderness: There is no abdominal tenderness. There is no  guarding or rebound.  ?Musculoskeletal:     ?   General: Normal range of motion.  ?   Cervical back: Normal range of motion and neck supple. No muscular tenderness.  ?   Right lower leg: No tenderness. No edema.  ?   Left lower leg: No tenderness. No edema.  ?   Comments: No calf tenderness on the right or left.  Patient reports tenderness to palpation that is worse with weightbearing on the lateral aspect and anterior portion of the right knee.  No knee effusion.  Patient with full active range of motion.  ?Skin: ?   General: Skin is warm and dry.  ?   Coloration: Skin is not pale.  ?Neurological:  ?   Mental Status: She is alert.  ? ? ?ED Results / Procedures / Treatments   ?Labs ?(all labs ordered are listed, but only abnormal results are displayed) ?Labs Reviewed  ?BASIC METABOLIC PANEL - Abnormal; Notable for the following components:  ?    Result  Value  ? BUN <5 (*)   ? Calcium 8.8 (*)   ? All other components within normal limits  ?CBC - Abnormal; Notable for the following components:  ? Platelets 449 (*)   ? All other components within normal limits  ?D-DIMER, QUANTITATIVE  ?I-STAT BETA HCG BLOOD, ED ([facility], [facility], [facility] ONLY)  ?TROPONIN I (HIGH SENSITIVITY)  ?TROPONIN I (HIGH SENSITIVITY)  ? ? ?EKG ?EKG Interpretation ? ?Date/Time:  Monday Oct 03 2021 11:21:04 EDT ?Ventricular Rate:  80 ?PR Interval:  128 ?QRS Duration: 85 ?QT Interval:  387 ?QTC Calculation: 447 ?R Axis:   42 ?Text Interpretation: Sinus rhythm Borderline repolarization abnormality Confirmed by Kristine Royal [id number]) on 10/03/2021 11:28:57 AM ? ?Radiology ?DG Chest 2 View ? ?Result Date: 10/03/2021 ?CLINICAL DATA:  Intermittent left chest pain since yesterday with shortness of breath. EXAM: CHEST - 2 VIEW COMPARISON:  None. FINDINGS: The heart size and mediastinal contours are within normal limits. Both lungs are clear. The visualized skeletal structures are unremarkable. There is scoliosis of spine. IMPRESSION: No active cardiopulmonary disease. Electronically Signed   By: Sherian Rein M.D.   On: 10/03/2021 11:10   ? ?Procedures ?Procedures  ? ? ?Medications Ordered in ED ?Medications - No data to display ? ?ED Course/ Medical Decision Making/ A&P ?  ? ?Patient seen and examined. History obtained directly from patient. Work-up including labs, imaging, EKG ordered in triage, if performed, were reviewed.   ? ?Labs/EKG: Independently reviewed and interpreted.  This included: EKG which was unremarkable.  Pregnancy.  CBC unremarkable, BMP unremarkable.  Troponin normal.  Added D-dimer after discussion with patient. ? ?Imaging: Independently visualized and interpreted.  This included: Chest x-ray, agree normal. ? ?Medications/Fluids: None ordered. ? ?Most recent vital signs reviewed and are as follows: ?BP (!) 148/101   Pulse 74   Temp 98.4 ?F (36.9 ?C) (Oral)   Resp 16   Ht 5\' 5"  (1.651 m)   Wt 95.3  kg   LMP 01/18/2021 (Approximate)   SpO2 99%   Breastfeeding Unknown   BMI 34.95 kg/m?  ? ?Initial impression: Chest pain, shortness of breath, intermittent.  Currently resolved.  Patient does have some recurrent features.  Also reports right knee pain although both of these problems appear to be chronic and intermittent.  Will rule out possibility of PE. ? ?2:07 PM Reassessment performed. Patient appears comfortable.  She is requesting crackers and something to drink. ? ?Labs personally reviewed and interpreted including: D-dimer  was negative, second troponin negative. ? ?Reviewed pertinent lab work and imaging with patient at bedside. Questions answered.  ? ?Most current vital signs reviewed and are as follows: ?BP (!) 133/92   Pulse 80   Temp 98.5 ?F (36.9 ?C) (Oral)   Resp 17   Ht 5\' 5"  (1.651 m)   Wt 95.3 kg   LMP 01/18/2021 (Approximate)   SpO2 100%   Breastfeeding Unknown   BMI 34.95 kg/m?  ? ?Plan: Discharge to home.  ? ?Prescriptions written for: None ? ?Other home care instructions discussed: Avoidance of anything that makes her symptoms worse ? ?ED return instructions discussed: Return and follow-up instructions: I encouraged patient to return to ED with severe chest pain, especially if the pain is crushing or pressure-like and spreads to the arms, back, neck, or jaw, or if they have associated sweating, vomiting, or shortness of breath with the pain, or significant pain with activity. We discussed that the evaluation here today indicates a low-risk of serious cause of chest pain, including heart trouble or a blood clot, but no evaluation is perfect and chest pain can evolve with time. The patient verbalized understanding and agreed.  I encouraged patient to follow-up with their provider in the next 48 hours for recheck.   ? ?Follow-up instructions discussed: Patient encouraged to follow-up with their PCP in 3 days.  ? ? ? ?                        ?Medical Decision Making ?Amount and/or  Complexity of Data Reviewed ?Labs: ordered. ?Radiology: ordered. ? ? ?For this patient's complaint of chest pain, the following emergent conditions were considered on the differential diagnosis: acute coronary syndrome, pulmonary emb

## 2021-10-03 NOTE — ED Triage Notes (Signed)
Reports chest pain that started last night with sob and dizziness.  Denies radiation.  ?

## 2022-01-11 ENCOUNTER — Encounter (INDEPENDENT_AMBULATORY_CARE_PROVIDER_SITE_OTHER): Payer: Self-pay

## 2022-08-31 ENCOUNTER — Ambulatory Visit: Payer: BC Managed Care – PPO | Admitting: Diagnostic Neuroimaging

## 2022-10-04 ENCOUNTER — Ambulatory Visit: Payer: BC Managed Care – PPO | Admitting: Neurology

## 2022-10-04 NOTE — Progress Notes (Signed)
[facility]  PATIENT: Ruth Ashley DOB: 30-May-1991  REFERRING DOCTOR OR PCP:  Norva Pavlov, OD; Jamison Oka, MD SOURCE: Patient, notes from optometry, MRI reports and images from 2019.  _________________________________   HISTORICAL  CHIEF COMPLAINT:  Chief Complaint  Patient presents with   new patinet    Pt in room 10.New patient here for Papilledema. Pt said she had Papilledema in Feb 2019, ( went to [facility]) pt has double vision and blurred. Pt reports tingling and numbness in right leg comes and goes for years. Pt blood pressure elevated x 2, prescribed BP medication, but doesn't take, reports she forgets to take medication.    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   In 2019 she saw Dr. Everlena Cooper for diplopia ad headache and papilledema.   She had a lumbar puncture and was found to have elevated opening pressure of 30.5 cm water.   She thinks symptoms did better after th LP  She was placed on acetazolamide but it greatly altered her sense of taste and she discontinued.   She stayed on for about 3-4 months and has not been on any medication since the.    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  She has tried to eat well and exercise but has not lost any weight.   She snores and has had witnessed OSA.  A PSG had been discussed with her at some point but never done.  EPWORTH SLEEPINESS SCALE  On a scale of 0 - 3 what is the chance of dozing:  Sitting and Reading:   0 Watching TV:    2 Sitting inactive in a public  place: 0 Passenger in car for one hour: 0 Lying down to rest in the afternoon: 3 Sitting and talking to someone: 0 Sitting quietly after lunch:  0 In a car, stopped in traffic:  0  Total (out of 24):   5/24   Imaging: MRI of the brain 07/20/2017 showed normal parenchyma before and after contrast.  The pituitary gland was slightly flattened though this could be incidental.  Normal enhancement pattern.  MR venogram showed a dominant right transverse sinus and diminutive left transverse sinus.  Some reduced flow was noted distally in the transverse sinuses.  No thrombosis.   REVIEW OF SYSTEMS: Constitutional: No fevers, chills, sweats, or change in appetite Eyes: No visual changes, double vision, eye pain Ear, nose and throat: No hearing loss, ear pain, nasal congestion, sore throat Cardiovascular: No chest pain, palpitations Respiratory:  No shortness of breath at rest or with exertion.   No wheezes GastrointestinaI: No nausea, vomiting, diarrhea, abdominal pain, fecal incontinence Genitourinary:  No dysuria, urinary retention or frequency.  No nocturia. Musculoskeletal:  No neck pain, back pain Integumentary: No rash, pruritus, skin lesions Neurological: as above Psychiatric: No depression at this time.  No anxiety Endocrine: No palpitations, diaphoresis, change in appetite, change in weigh or increased thirst Hematologic/Lymphatic:  No anemia, purpura, petechiae. Allergic/Immunologic: No itchy/runny eyes, nasal congestion, recent allergic reactions, rashes  ALLERGIES: No Known Allergies  HOME MEDICATIONS:  Current Outpatient Medications:  acetaminophen (TYLENOL) 500 MG tablet, Take 1,000 mg by mouth every 6 (six) hours as needed for mild pain., Disp: , Rfl:    methazolamide (NEPTAZANE) 50 MG tablet, Take 1 tablet (50 mg total) by mouth 3 (three) times daily., Disp: 90 tablet, Rfl: 11  PAST MEDICAL HISTORY: Past Medical History:  Diagnosis Date   Endometriosis     Hypertension     PAST SURGICAL HISTORY: Past Surgical History:  Procedure Laterality Date   NO PAST SURGERIES     tummy tuck  07/22/2020    FAMILY HISTORY: Family History  Problem Relation Age of Onset   Hypertension Mother    Kidney disease Mother    Liver cancer Mother    Cancer Father    Sudden death Father     SOCIAL HISTORY: Social History   Socioeconomic History   Marital status: Single    Spouse name: Not on file   Number of children: 2   Years of education: Not on file   Highest education level: Not on file  Occupational History   Occupation: Dispensing optician  Tobacco Use   Smoking status: Never   Smokeless tobacco: Never  Vaping Use   Vaping Use: Never used  Substance and Sexual Activity   Alcohol use: Yes    Comment: every couple weeks   Drug use: No   Sexual activity: Yes    Birth control/protection: None  Other Topics Concern   Not on file  Social History Narrative   Right handed    Drink coffee    Wears glasses    Social Determinants of Health   Financial Resource Strain: Not on file  Food Insecurity: Not on file  Transportation Needs: Not on file  Physical Activity: Not on file  Stress: Not on file  Social Connections: Not on file  Intimate Partner Violence: Not on file       PHYSICAL EXAM  Vitals:   10/05/22 1245 10/05/22 1253  BP: (!) 147/103 (!) 148/96  Pulse: (!) 119 (!) 119  Weight: 219 lb (99.3 kg)   Height: 5\' 5"  (1.651 m)     Body mass index is 36.44 kg/m.   General: The patient is well-developed and well-nourished and in no acute distress  HEENT:  Head is [place]/AT.  Sclera are anicteric.  Funduscopic exam showed optic nerve edema bilaterally, left greater than right.  There were no venous pulsations.    Neck: No carotid bruits are noted.  The neck is nontender.  Cardiovascular: The heart has a regular rate and rhythm with a normal S1 and S2. There were no murmurs, gallops or rubs.    Skin: Extremities are  without rash or  edema.  Musculoskeletal:  Back is nontender  Neurologic Exam  Mental status: The patient is alert and oriented x 3 at the time of the examination. The patient has apparent normal recent and remote memory, with an apparently normal attention span and concentration ability.   Speech is normal.  Cranial nerves: Extraocular movements are full. Pupils are equal, round, and reactive to light and accomodation.  Visual fields are full.  Facial symmetry is present. There is good facial sensation to soft touch bilaterally.Facial strength is normal.  Trapezius and sternocleidomastoid strength is normal. No dysarthria is noted.  The tongue is midline, and the patient has symmetric elevation of the soft palate. No obvious hearing deficits are noted.  Motor:  Muscle bulk is normal.   Tone is normal. Strength is  5 / 5  in all 4 extremities.   Sensory: Sensory testing is intact to pinprick, soft touch and vibration sensation in all 4 extremities.  Coordination: Cerebellar testing reveals good finger-nose-finger and heel-to-shin bilaterally.  Gait and station: Station is normal.   Gait is normal. Tandem gait is normal. Romberg is negative.   Reflexes: Deep tendon reflexes are symmetric and normal bilaterally.   Plantar responses are flexor.    DIAGNOSTIC DATA (LABS, IMAGING, TESTING) - I reviewed patient records, labs, notes, testing and imaging myself where available.  Lab Results  Component Value Date   WBC 7.9 10/03/2021   HGB 13.3 10/03/2021   HCT 40.5 10/03/2021   MCV 84.0 10/03/2021   PLT 449 (H) 10/03/2021      Component Value Date/Time   NA 138 10/03/2021 1059   NA 141 12/09/2019 1323   K 4.1 10/03/2021 1059   CL 104 10/03/2021 1059   CO2 25 10/03/2021 1059   GLUCOSE 96 10/03/2021 1059   BUN <5 (L) 10/03/2021 1059   BUN 7 12/09/2019 1323   CREATININE 0.81 10/03/2021 1059   CALCIUM 8.8 (L) 10/03/2021 1059   PROT 7.5 03/10/2021 1024   PROT 7.7 12/09/2019 1323    ALBUMIN 3.5 03/10/2021 1024   ALBUMIN 4.5 12/09/2019 1323   AST 17 03/10/2021 1024   ALT 15 03/10/2021 1024   ALKPHOS 47 03/10/2021 1024   BILITOT 0.4 03/10/2021 1024   BILITOT 0.2 12/09/2019 1323   GFRNONAA >60 10/03/2021 1059   GFRAA 114 12/09/2019 1323   Lab Results  Component Value Date   CHOL 209 (H) 12/09/2019   HDL 76 12/09/2019   LDLCALC 100 (H) 12/09/2019   TRIG 197 (H) 12/09/2019   Lab Results  Component Value Date   HGBA1C 5.3 12/09/2019   Lab Results  Component Value Date   VITAMINB12 408 12/09/2019   Lab Results  Component Value Date   TSH 1.390 12/09/2019       ASSESSMENT AND PLAN  Idiopathic intracranial hypertension - Plan: Home sleep test, DG FL GUIDED LUMBAR PUNCTURE  Optic nerve edema - Plan: Home sleep test, DG FL GUIDED LUMBAR PUNCTURE  Snoring - Plan: Home sleep test  Essential hypertension   In summary, Ruth Ashley is a 32 year old woman with idiopathic intracranial hypertension, initially diagnosed several years ago.  Current symptoms are consistent with that diagnosis.  Because she is having papilledema with visual changes on visual fields, I will request a lumbar puncture with measurement pressure.  She has been unable to tolerate acetazolamide several years ago.  Therefore, we will start methazolamide which is often better tolerated than acetazolamide.  If that is not covered by her insurance or she has difficulty tolerating, consider zonisamide.  Additionally, she has snoring and has had witnessed OSA.  We will check a home sleep study.  We also discussed that if she does not get benefit from pills was unable to tolerate them that weight loss would also be beneficial.  Additionally, there are surgical treatments of idiopathic intracranial hypertension but I would want to try a couple medications first  She will return to see me in 6 months or sooner if there are new or worsening neurologic symptoms.     Danilyn Cocke A. Epimenio Foot, MD, [facility]  10/05/2022, 1:38 PM Certified in Neurology, Clinical Neurophysiology, Sleep Medicine and Neuroimaging  [facility] [address] [place], [place] [address] [phone]

## 2022-10-05 ENCOUNTER — Ambulatory Visit: Payer: BC Managed Care – PPO | Admitting: Neurology

## 2022-10-05 ENCOUNTER — Encounter: Payer: Self-pay | Admitting: Neurology

## 2022-10-05 VITALS — BP 148/96 | HR 119 | Ht 65.0 in | Wt 219.0 lb

## 2022-10-05 DIAGNOSIS — I10 Essential (primary) hypertension: Secondary | ICD-10-CM | POA: Diagnosis not present

## 2022-10-05 DIAGNOSIS — H4710 Unspecified papilledema: Secondary | ICD-10-CM | POA: Diagnosis not present

## 2022-10-05 DIAGNOSIS — R0683 Snoring: Secondary | ICD-10-CM

## 2022-10-05 DIAGNOSIS — G932 Benign intracranial hypertension: Secondary | ICD-10-CM | POA: Diagnosis not present

## 2022-10-05 MED ORDER — METHAZOLAMIDE 50 MG PO TABS: 50.0000 mg | ORAL_TABLET | Freq: Three times a day (TID) | ORAL | 11 refills | Status: AC

## 2022-10-18 ENCOUNTER — Ambulatory Visit (INDEPENDENT_AMBULATORY_CARE_PROVIDER_SITE_OTHER): Payer: Self-pay | Admitting: Neurology

## 2022-10-18 DIAGNOSIS — Z0289 Encounter for other administrative examinations: Secondary | ICD-10-CM

## 2022-10-18 DIAGNOSIS — G932 Benign intracranial hypertension: Secondary | ICD-10-CM

## 2022-10-18 DIAGNOSIS — H4710 Unspecified papilledema: Secondary | ICD-10-CM

## 2022-10-18 DIAGNOSIS — R0683 Snoring: Secondary | ICD-10-CM

## 2022-11-13 ENCOUNTER — Inpatient Hospital Stay
Admission: RE | Admit: 2022-11-13 | Discharge: 2022-11-13 | Disposition: A | Discharge status: Home / Self Care | Payer: BC Managed Care – PPO | Source: Ambulatory Visit | Attending: Neurology | Admitting: Neurology

## 2022-11-13 NOTE — Discharge Instructions (Signed)

## 2023-01-04 ENCOUNTER — Telehealth: Payer: Self-pay | Admitting: *Deleted

## 2023-01-04 NOTE — Telephone Encounter (Signed)
I called and LMVM for pt to return call about the LP order, received message had not had done.  Looking futher she did have scheduled but then no showed the appt. 11-13-2022

## 2023-01-04 NOTE — Telephone Encounter (Signed)
-----   Message from Asa Lente sent at 01/04/2023 12:57 PM EDT ----- Regarding: lumbar puncture I got a message that she never did her lumbar puncture [facility].  This was being performed for idiopathic intracranial hypertension.  Could you try to contact the patient to see if there was a reason that this was not done.  We can reorder it if we need to

## 2023-01-08 NOTE — Telephone Encounter (Signed)
Called and LVM requesting CB. Will also try to send a [facility] message

## 2023-01-09 NOTE — Telephone Encounter (Signed)
Called and LVM requesting callback, will wait for return call at this time

## 2023-04-12 ENCOUNTER — Ambulatory Visit: Payer: BC Managed Care – PPO | Admitting: Neurology

## 2023-04-12 ENCOUNTER — Encounter: Payer: Self-pay | Admitting: Neurology
